# Patient Record
Sex: Female | Born: 1995 | Race: Black or African American | Hispanic: No | Marital: Single | Smoking: Never smoker
Health system: Southern US, Community
[De-identification: ages and names within clinical notes are randomized; demographics above are authoritative.]

## PROBLEM LIST (undated history)

## (undated) ENCOUNTER — Inpatient Hospital Stay (HOSPITAL_COMMUNITY): Payer: Self-pay

## (undated) DIAGNOSIS — Z789 Other specified health status: Secondary | ICD-10-CM

---
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
## 2019-01-22 ENCOUNTER — Encounter (HOSPITAL_COMMUNITY): Payer: Self-pay

## 2019-01-22 ENCOUNTER — Inpatient Hospital Stay (HOSPITAL_COMMUNITY)
Admission: AD | Admit: 2019-01-22 | Discharge: 2019-01-22 | Disposition: A | Discharge status: Home / Self Care | Payer: No Typology Code available for payment source | Source: Ambulatory Visit | Attending: Obstetrics & Gynecology | Admitting: Obstetrics & Gynecology

## 2019-01-22 ENCOUNTER — Inpatient Hospital Stay (HOSPITAL_COMMUNITY): Payer: No Typology Code available for payment source

## 2019-01-22 DIAGNOSIS — Z3A01 Less than 8 weeks gestation of pregnancy: Secondary | ICD-10-CM | POA: Diagnosis not present

## 2019-01-22 DIAGNOSIS — N760 Acute vaginitis: Secondary | ICD-10-CM | POA: Diagnosis not present

## 2019-01-22 DIAGNOSIS — O26891 Other specified pregnancy related conditions, first trimester: Secondary | ICD-10-CM

## 2019-01-22 DIAGNOSIS — R102 Pelvic and perineal pain: Secondary | ICD-10-CM

## 2019-01-22 DIAGNOSIS — B9689 Other specified bacterial agents as the cause of diseases classified elsewhere: Secondary | ICD-10-CM | POA: Diagnosis not present

## 2019-01-22 DIAGNOSIS — O26899 Other specified pregnancy related conditions, unspecified trimester: Secondary | ICD-10-CM

## 2019-01-22 DIAGNOSIS — O23591 Infection of other part of genital tract in pregnancy, first trimester: Secondary | ICD-10-CM | POA: Diagnosis not present

## 2019-01-22 DIAGNOSIS — R109 Unspecified abdominal pain: Secondary | ICD-10-CM | POA: Diagnosis present

## 2019-01-22 DIAGNOSIS — O3680X0 Pregnancy with inconclusive fetal viability, not applicable or unspecified: Secondary | ICD-10-CM

## 2019-01-22 HISTORY — DX: Other specified health status: Z78.9

## 2019-01-22 LAB — WET PREP, GENITAL
Sperm: NONE SEEN
Trich, Wet Prep: NONE SEEN
Yeast Wet Prep HPF POC: NONE SEEN

## 2019-01-22 LAB — CBC
HCT: 36.8 % (ref 36.0–46.0)
Hemoglobin: 12.7 g/dL (ref 12.0–15.0)
MCH: 30 pg (ref 26.0–34.0)
MCHC: 34.5 g/dL (ref 30.0–36.0)
MCV: 87 fL (ref 80.0–100.0)
Platelets: 305 10*3/uL (ref 150–400)
RBC: 4.23 MIL/uL (ref 3.87–5.11)
RDW: 12.6 % (ref 11.5–15.5)
WBC: 8.8 10*3/uL (ref 4.0–10.5)
nRBC: 0 % (ref 0.0–0.2)

## 2019-01-22 LAB — URINALYSIS, ROUTINE W REFLEX MICROSCOPIC
Bilirubin Urine: NEGATIVE
Glucose, UA: NEGATIVE mg/dL
Hgb urine dipstick: NEGATIVE
Ketones, ur: 15 mg/dL — AB
Leukocytes, UA: NEGATIVE
Nitrite: NEGATIVE
PH: 6.5 (ref 5.0–8.0)
Protein, ur: NEGATIVE mg/dL
Specific Gravity, Urine: 1.02 (ref 1.005–1.030)

## 2019-01-22 LAB — RAPID URINE DRUG SCREEN, HOSP PERFORMED
Amphetamines: NOT DETECTED
BENZODIAZEPINES: NOT DETECTED
Barbiturates: NOT DETECTED
Cocaine: NOT DETECTED
Opiates: NOT DETECTED
Tetrahydrocannabinol: NOT DETECTED

## 2019-01-22 LAB — HCG, QUANTITATIVE, PREGNANCY: hCG, Beta Chain, Quant, S: 2689 m[IU]/mL — ABNORMAL HIGH (ref <5)

## 2019-01-22 LAB — ABO/RH: ABO/RH(D): O POS

## 2019-01-22 LAB — POCT PREGNANCY, URINE: Preg Test, Ur: POSITIVE — AB

## 2019-01-22 LAB — HIV ANTIBODY (ROUTINE TESTING W REFLEX): HIV Screen 4th Generation wRfx: NONREACTIVE

## 2019-01-22 MED ORDER — HYOSCYAMINE SULFATE 0.125 MG SL SUBL: 0.1250 mg | SUBLINGUAL_TABLET | Freq: Once | SUBLINGUAL | Status: DC

## 2019-01-22 MED ORDER — TRAMADOL HCL 50 MG PO TABS
50.0000 mg | ORAL_TABLET | Freq: Once | ORAL | Status: AC
  Administered 2019-01-22: 50 mg via ORAL
  Filled 2019-01-22: qty 1

## 2019-01-22 MED ORDER — METRONIDAZOLE 500 MG PO TABS: 500.0000 mg | ORAL_TABLET | Freq: Two times a day (BID) | ORAL | 0 refills | Status: DC

## 2019-01-22 NOTE — Discharge Instructions (Signed)
First Trimester of Pregnancy  The first trimester of pregnancy is from week 1 until the end of week 13 (months 1 through 3). During this time, your baby will begin to develop inside you. At 6-8 weeks, the eyes and face are formed, and the heartbeat can be seen on ultrasound. At the end of 12 weeks, all the baby's organs are formed. Prenatal care is all the medical care you receive before the birth of your baby. Make sure you get good prenatal care and follow all of your doctor's instructions. Follow these instructions at home: Medicines  Take over-the-counter and prescription medicines only as told by your doctor. Some medicines are safe and some medicines are not safe during pregnancy.  Take a prenatal vitamin that contains at least 600 micrograms (mcg) of folic acid.  If you have trouble pooping (constipation), take medicine that will make your stool soft (stool softener) if your doctor approves. Eating and drinking   Eat regular, healthy meals.  Your doctor will tell you the amount of weight gain that is right for you.  Avoid raw meat and uncooked cheese.  If you feel sick to your stomach (nauseous) or throw up (vomit): ? Eat 4 or 5 small meals a day instead of 3 large meals. ? Try eating a few soda crackers. ? Drink liquids between meals instead of during meals.  To prevent constipation: ? Eat foods that are high in fiber, like fresh fruits and vegetables, whole grains, and beans. ? Drink enough fluids to keep your pee (urine) clear or pale yellow. Activity  Exercise only as told by your doctor. Stop exercising if you have cramps or pain in your lower belly (abdomen) or low back.  Do not exercise if it is too hot, too humid, or if you are in a place of great height (high altitude).  Try to avoid standing for long periods of time. Move your legs often if you must stand in one place for a long time.  Avoid heavy lifting.  Wear low-heeled shoes. Sit and stand up straight.   You can have sex unless your doctor tells you not to. Relieving pain and discomfort  Wear a good support bra if your breasts are sore.  Take warm water baths (sitz baths) to soothe pain or discomfort caused by hemorrhoids. Use hemorrhoid cream if your doctor says it is okay.  Rest with your legs raised if you have leg cramps or low back pain.  If you have puffy, bulging veins (varicose veins) in your legs: ? Wear support hose or compression stockings as told by your doctor. ? Raise (elevate) your feet for 15 minutes, 3-4 times a day. ? Limit salt in your food. Prenatal care  Schedule your prenatal visits by the twelfth week of pregnancy.  Write down your questions. Take them to your prenatal visits.  Keep all your prenatal visits as told by your doctor. This is important. Safety  Wear your seat belt at all times when driving.  Make a list of emergency phone numbers. The list should include numbers for family, friends, the hospital, and police and fire departments. General instructions  Ask your doctor for a referral to a local prenatal class. Begin classes no later than at the start of month 6 of your pregnancy.  Ask for help if you need counseling or if you need help with nutrition. Your doctor can give you advice or tell you where to go for help.  Do not use hot tubs, steam   rooms, or saunas.  Do not douche or use tampons or scented sanitary pads.  Do not cross your legs for long periods of time.  Avoid all herbs and alcohol. Avoid drugs that are not approved by your doctor.  Do not use any tobacco products, including cigarettes, chewing tobacco, and electronic cigarettes. If you need help quitting, ask your doctor. You may get counseling or other support to help you quit.  Avoid cat litter boxes and soil used by cats. These carry germs that can cause birth defects in the baby and can cause a loss of your baby (miscarriage) or stillbirth.  Visit your dentist. At home,  brush your teeth with a soft toothbrush. Be gentle when you floss. Contact a doctor if:  You are dizzy.  You have mild cramps or pressure in your lower belly.  You have a nagging pain in your belly area.  You continue to feel sick to your stomach, you throw up, or you have watery poop (diarrhea).  You have a bad smelling fluid coming from your vagina.  You have pain when you pee (urinate).  You have increased puffiness (swelling) in your face, hands, legs, or ankles. Get help right away if:  You have a fever.  You are leaking fluid from your vagina.  You have spotting or bleeding from your vagina.  You have very bad belly cramping or pain.  You gain or lose weight rapidly.  You throw up blood. It may look like coffee grounds.  You are around people who have German measles, fifth disease, or chickenpox.  You have a very bad headache.  You have shortness of breath.  You have any kind of trauma, such as from a fall or a car accident. Summary  The first trimester of pregnancy is from week 1 until the end of week 13 (months 1 through 3).  To take care of yourself and your unborn baby, you will need to eat healthy meals, take medicines only if your doctor tells you to do so, and do activities that are safe for you and your baby.  Keep all follow-up visits as told by your doctor. This is important as your doctor will have to ensure that your baby is healthy and growing well. This information is not intended to replace advice given to you by your health care provider. Make sure you discuss any questions you have with your health care provider. Document Released: 05/21/2008 Document Revised: 12/11/2016 Document Reviewed: 12/11/2016 Elsevier Interactive Patient Education  2019 Elsevier Inc[employer]  

## 2019-01-22 NOTE — MAU Note (Signed)
Had a positive HPT on Monday-confirmed at her Drs office on 2/2.  LMP 1/2.  Abdominal pain for the past week that is intermittent.  Also reports feeling hot/sweaty.  No VB.  Hasn't taken anything for the pain.

## 2019-01-22 NOTE — MAU Provider Note (Signed)
Chief Complaint: Abdominal Pain and Possible Pregnancy   First Provider Initiated Contact with Patient 01/22/19 0307        SUBJECTIVE HPI: Stephanie Ortiz is a 23 y.o. at [redacted]w[redacted]d by LMP who presents to maternity admissions reporting suprapubic cramping for several weeks, which got worse tonight.  No bleeding. Had a pregnancy test at Urgent Care but no other exams.  Was on sprintec pills but stopped them due to weight gain. . She denies vaginal bleeding, vaginal itching/burning, urinary symptoms, h/a, dizziness, n/v, or fever/chills.    Abdominal Pain  This is a new problem. The current episode started 1 to 4 weeks ago. The onset quality is gradual. The problem occurs intermittently. The problem has been gradually worsening. The pain is located in the suprapubic region. The quality of the pain is cramping and sharp. The abdominal pain does not radiate. Pertinent negatives include no constipation, diarrhea, dysuria, fever, frequency, headaches, myalgias, nausea or vomiting. Nothing aggravates the pain. The pain is relieved by nothing. She has tried nothing for the symptoms.  Possible Pregnancy  This is a new problem. The current episode started in the past 7 days. Associated symptoms include abdominal pain. Pertinent negatives include no chills, fever, headaches, myalgias, nausea or vomiting. Nothing aggravates the symptoms. She has tried nothing for the symptoms.   RN note: Had a positive HPT on Monday-confirmed at her Drs office on 2/2.  LMP 1/2.  Abdominal pain for the past week that is intermittent.  Also reports feeling hot/sweaty.  No VB.  Hasn't taken anything for the pain.   Social History   Socioeconomic History  . Marital status: Single    Spouse name: Not on file  . Number of children: Not on file  . Years of education: Not on file  . Highest education level: Not on file  Occupational History  . Not on file  Social Needs  . Financial resource strain: Not on file  . Food  insecurity:    Worry: Not on file    Inability: Not on file  . Transportation needs:    Medical: Not on file    Non-medical: Not on file  Tobacco Use  . Smoking status: Not on file  Substance and Sexual Activity  . Alcohol use: Not on file  . Drug use: Not on file  . Sexual activity: Not on file  Lifestyle  . Physical activity:    Days per week: Not on file    Minutes per session: Not on file  . Stress: Not on file  Relationships  . Social connections:    Talks on phone: Not on file    Gets together: Not on file    Attends religious service: Not on file    Active member of club or organization: Not on file    Attends meetings of clubs or organizations: Not on file    Relationship status: Not on file  . Intimate partner violence:    Fear of current or ex partner: Not on file    Emotionally abused: Not on file    Physically abused: Not on file    Forced sexual activity: Not on file  Other Topics Concern  . Not on file  Social History Narrative  . Not on file   No current facility-administered medications on file prior to encounter.    No current outpatient medications on file prior to encounter.   Allergies not on file  I have reviewed patient's Past Medical Hx, Surgical Hx,  Family Hx, Social Hx, medications and allergies.   ROS:  Review of Systems  Constitutional: Negative for chills and fever.  Gastrointestinal: Positive for abdominal pain. Negative for constipation, diarrhea, nausea and vomiting.  Genitourinary: Negative for dysuria and frequency.  Musculoskeletal: Negative for myalgias.  Neurological: Negative for headaches.   Review of Systems  Other systems negative   Physical Exam  Physical Exam Patient Vitals for the past 24 hrs:  BP Temp Pulse Resp SpO2 Height Weight  01/22/19 0259 128/81 99.2 F (37.3 C) 87 17 100 % 5\' 9"  (1.753 m) 77.6 kg   Constitutional: Well-developed, well-nourished female in no acute distress.  Cardiovascular: normal  rate Respiratory: normal effort GI: Abd soft, non-tender. Pos BS x 4 MS: Extremities nontender, no edema, normal ROM Neurologic: Alert and oriented x 4.  GU: Neg CVAT.  PELVIC EXAM: Cervix pink, visually closed, without lesion, scant white creamy discharge, vaginal walls and external genitalia normal Bimanual exam: Cervix 0/long/high, firm, anterior, neg CMT, uterus tender, nonenlarged, adnexa without tenderness, enlargement, or mass   LAB RESULTS Results for orders placed or performed during the hospital encounter of 01/22/19 (from the past 24 hour(s))  hCG, quantitative, pregnancy     Status: Abnormal   Collection Time: 01/22/19  3:11 AM  Result Value Ref Range   hCG, Beta Chain, Quant, S 2,689 (H) <5 mIU/mL  CBC     Status: None   Collection Time: 01/22/19  3:11 AM  Result Value Ref Range   WBC 8.8 4.0 - 10.5 K/uL   RBC 4.23 3.87 - 5.11 MIL/uL   Hemoglobin 12.7 12.0 - 15.0 g/dL   HCT 16.136.8 09.636.0 - 04.546.0 %   MCV 87.0 80.0 - 100.0 fL   MCH 30.0 26.0 - 34.0 pg   MCHC 34.5 30.0 - 36.0 g/dL   RDW 40.912.6 81.111.5 - 91.415.5 %   Platelets 305 150 - 400 K/uL   nRBC 0.0 0.0 - 0.2 %  ABO/Rh     Status: None   Collection Time: 01/22/19  3:11 AM  Result Value Ref Range   ABO/RH(D)      O POS Performed at [facility], [address] [place], [place] [id number]   Pregnancy, urine POC     Status: Abnormal   Collection Time: 01/22/19  3:16 AM  Result Value Ref Range   Preg Test, Ur POSITIVE (A) NEGATIVE  Urinalysis, Routine w reflex microscopic     Status: Abnormal   Collection Time: 01/22/19  3:23 AM  Result Value Ref Range   Color, Urine YELLOW YELLOW   APPearance CLEAR CLEAR   Specific Gravity, Urine 1.020 1.005 - 1.030   pH 6.5 5.0 - 8.0   Glucose, UA NEGATIVE NEGATIVE mg/dL   Hgb urine dipstick NEGATIVE NEGATIVE   Bilirubin Urine NEGATIVE NEGATIVE   Ketones, ur 15 (A) NEGATIVE mg/dL   Protein, ur NEGATIVE NEGATIVE mg/dL   Nitrite NEGATIVE NEGATIVE   Leukocytes, UA NEGATIVE  NEGATIVE  Wet prep, genital     Status: Abnormal   Collection Time: 01/22/19  3:42 AM  Result Value Ref Range   Yeast Wet Prep HPF POC NONE SEEN NONE SEEN   Trich, Wet Prep NONE SEEN NONE SEEN   Clue Cells Wet Prep HPF POC PRESENT (A) NONE SEEN   WBC, Wet Prep HPF POC FEW (A) NONE SEEN   Sperm NONE SEEN       IMAGING [place] Ob Comp Less 14 Wks  Result Date: 01/22/2019 CLINICAL DATA:  Pelvic  pain in first-trimester pregnancy EXAM: OBSTETRIC <14 WK US AND TRANSVAGINAL OB US TECHNIQUE: Both transabdominal and transvaginal ultrasound examinations were performed for complete evaluation of the gestation as well as the maternal uterus, adnexal regions, and pelvic cul-de-sac. Transvaginal technique was performed to assess early pregnancy. COMPARISON:  None. FINDINGS: Intrauterine gestational sac: Likely present Yolk sac:  Not seen Embryo:  Not Seen MSD: 3.2  mm   5 w   0  d Subchorionic hemorrhage:  None visualized. Maternal uterus/adnexae: Normal. IMPRESSION: Intrauterine sac (with mean sac diameter correlating with 5 weeks) likely reflecting early gestational but no embryo or yolk sac is visible yet. Consider follow-up quantitative B-HCG levels and follow-up US in 14 days to assess viability. No pathologic finding. Electronically Signed   By: Marnee SpringJonathon  Watts M.D.   On: 01/22/2019 04:32   [place] Ob Transvaginal  Result Date: 01/22/2019 CLINICAL DATA:  Pelvic pain in first-trimester pregnancy EXAM: OBSTETRIC <14 WK US AND TRANSVAGINAL OB US TECHNIQUE: Both transabdominal and transvaginal ultrasound examinations were performed for complete evaluation of the gestation as well as the maternal uterus, adnexal regions, and pelvic cul-de-sac. Transvaginal technique was performed to assess early pregnancy. COMPARISON:  None. FINDINGS: Intrauterine gestational sac: Likely present Yolk sac:  Not seen Embryo:  Not Seen MSD: 3.2  mm   5 w   0  d Subchorionic hemorrhage:  None visualized. Maternal uterus/adnexae: Normal.  IMPRESSION: Intrauterine sac (with mean sac diameter correlating with 5 weeks) likely reflecting early gestational but no embryo or yolk sac is visible yet. Consider follow-up quantitative B-HCG levels and follow-up US in 14 days to assess viability. No pathologic finding. Electronically Signed   By: Marnee SpringJonathon  Watts M.D.   On: 01/22/2019 04:32     MAU Management/MDM: Ordered usual first trimester r/o ectopic labs.   Pelvic exam and cultures done Will check baseline Ultrasound to rule out ectopic.  This bleeding/pain can represent a normal pregnancy with bleeding, spontaneous abortion or even an ectopic which can be life-threatening.  The process as listed above helps to determine which of these is present.  Reviewed findings.  Recommend repeating HCG on Saturday.  States is going out of town but I suggested she come before and get test done.   ASSESSMENT Pregnancy at 7160w0d by LMP Pelvic pain affecting pregnancy - Plan: US OB Comp Less 14 Wks, US OB Transvaginal, US OB Comp Less 14 Wks, US OB Transvaginal, Discharge patient  Pregnancy of unknown anatomic location - Plan: US OB Comp Less 14 Wks, US OB Transvaginal, US OB Comp Less 14 Wks, US OB Transvaginal, Discharge patient  Bacterial vaginosis - Plan: Discharge patient    PLAN Discharge home Plan to repeat HCG level in 48 hours Will repeat  Ultrasound in about 7-10 days if HCG levels double appropriately  Ectopic precautions Rx Flagyl for BV  Pt stable at time of discharge. Encouraged to return here or to other Urgent Care/ED if she develops worsening of symptoms, increase in pain, fever, or other concerning symptoms.    Wynelle BourgeoisMarie Capp CNM, MSN Certified Nurse-Midwife 01/22/2019  3:07 AM

## 2019-01-24 LAB — GC/CHLAMYDIA PROBE AMP (~~LOC~~) NOT AT ARMC
Chlamydia: NEGATIVE
Neisseria Gonorrhea: NEGATIVE

## 2020-11-11 ENCOUNTER — Encounter: Payer: Self-pay | Admitting: Emergency Medicine

## 2020-11-11 ENCOUNTER — Other Ambulatory Visit: Payer: Self-pay

## 2020-11-11 ENCOUNTER — Ambulatory Visit
Admission: EM | Admit: 2020-11-11 | Discharge: 2020-11-11 | Disposition: A | Discharge status: Home / Self Care | Payer: PRIVATE HEALTH INSURANCE | Attending: Emergency Medicine | Admitting: Emergency Medicine

## 2020-11-11 DIAGNOSIS — J069 Acute upper respiratory infection, unspecified: Secondary | ICD-10-CM | POA: Diagnosis not present

## 2020-11-11 DIAGNOSIS — Z20822 Contact with and (suspected) exposure to covid-19: Secondary | ICD-10-CM

## 2020-11-11 MED ORDER — FLUTICASONE PROPIONATE 50 MCG/ACT NA SUSP: 1.0000 | Freq: Every day | NASAL | 0 refills | Status: DC

## 2020-11-11 MED ORDER — CETIRIZINE HCL 10 MG PO TABS: 10.0000 mg | ORAL_TABLET | Freq: Every day | ORAL | 0 refills | Status: DC

## 2020-11-11 MED ORDER — BENZONATATE 100 MG PO CAPS: 100.0000 mg | ORAL_CAPSULE | Freq: Three times a day (TID) | ORAL | 0 refills | Status: DC

## 2020-11-11 NOTE — ED Provider Notes (Signed)
[name]    CSN: [id number] Arrival date & time: 11/11/20  1419      History   Chief Complaint Chief Complaint  Patient presents with  . Cough  . Fever    HPI Stephanie Ortiz is a 24 y.o. female  Presenting for 6-day course of fever, cough, congestion.  States cough is mildly productive: Clear/white.  No difficulty breathing, history of asthma.  Denies chest pain, vomiting.  States she feels a little bit better Wednesday and Thanksgiving, though felt worse after dinner.  No known sick contacts.  Past Medical History:  Diagnosis Date  . Medical history non-contributory     There are no problems to display for this patient.   History reviewed. No pertinent surgical history.  OB History    Gravida  1   Para      Term      Preterm      AB      Living        SAB      TAB      Ectopic      Multiple      Live Births               Home Medications    Prior to Admission medications   Medication Sig Start Date End Date Taking? Authorizing Provider  benzonatate (TESSALON) 100 MG capsule Take 1 capsule (100 mg total) by mouth every 8 (eight) hours. 11/11/20   Hall-Potvin, [place], PA-C  cetirizine (ZYRTEC ALLERGY) 10 MG tablet Take 1 tablet (10 mg total) by mouth daily. 11/11/20   Hall-Potvin, [place], PA-C  fluticasone (FLONASE) 50 MCG/ACT nasal spray Place 1 spray into both nostrils daily. 11/11/20   Hall-Potvin, [place], PA-C    Family History History reviewed. No pertinent family history.  Social History Social History   Tobacco Use  . Smoking status: Never Smoker  . Smokeless tobacco: Never Used  Substance Use Topics  . Alcohol use: Not Currently  . Drug use: Never     Allergies   Patient has no known allergies.   Review of Systems Review of Systems  Constitutional: Positive for fatigue and fever.  HENT: Positive for congestion. Negative for dental problem, ear pain, facial swelling, hearing loss, sinus pain,  sore throat, trouble swallowing and voice change.   Eyes: Negative for photophobia, pain and visual disturbance.  Respiratory: Positive for cough. Negative for shortness of breath and wheezing.   Cardiovascular: Negative for chest pain and palpitations.  Gastrointestinal: Negative for diarrhea and vomiting.  Musculoskeletal: Negative for arthralgias and myalgias.  Neurological: Negative for dizziness and headaches.     Physical Exam Triage Vital Signs ED Triage Vitals  Enc Vitals Group     BP 11/11/20 1505 129/89     Pulse Rate 11/11/20 1505 94     Resp 11/11/20 1505 18     Temp 11/11/20 1505 100 F (37.8 C)     Temp Source 11/11/20 1505 Oral     SpO2 11/11/20 1505 95 %     Weight --      Height --      Head Circumference --      Peak Flow --      Pain Score 11/11/20 1506 7     Pain Loc --      Pain Edu? --      Excl. in GC? --    No data found.  Updated Vital Signs BP 129/89 (BP Location: Left Arm)  Pulse 94   Temp 100 F (37.8 C) (Oral)   Resp 18   SpO2 95%   Visual Acuity Right Eye Distance:   Left Eye Distance:   Bilateral Distance:    Right Eye Near:   Left Eye Near:    Bilateral Near:     Physical Exam Constitutional:      General: She is not in acute distress.    Appearance: She is not ill-appearing or diaphoretic.  HENT:     Head: Normocephalic and atraumatic.     Right Ear: Tympanic membrane and ear canal normal.     Left Ear: Tympanic membrane and ear canal normal.     Mouth/Throat:     Mouth: Mucous membranes are moist.     Pharynx: Oropharynx is clear. No oropharyngeal exudate or posterior oropharyngeal erythema.  Eyes:     General: No scleral icterus.    Conjunctiva/sclera: Conjunctivae normal.     Pupils: Pupils are equal, round, and reactive to light.  Neck:     Comments: Trachea midline, negative JVD Cardiovascular:     Rate and Rhythm: Normal rate and regular rhythm.     Heart sounds: No murmur heard.  No gallop.   Pulmonary:      Effort: Pulmonary effort is normal. No respiratory distress.     Breath sounds: No wheezing, rhonchi or rales.  Musculoskeletal:     Cervical back: Neck supple. No tenderness.  Lymphadenopathy:     Cervical: No cervical adenopathy.  Skin:    Capillary Refill: Capillary refill takes less than 2 seconds.     Coloration: Skin is not jaundiced or pale.     Findings: No rash.  Neurological:     General: No focal deficit present.     Mental Status: She is alert and oriented to person, place, and time.      UC Treatments / Results  Labs (all labs ordered are listed, but only abnormal results are displayed) Labs Reviewed  NOVEL CORONAVIRUS, NAA    EKG   Radiology No results found.  Procedures Procedures (including critical care time)  Medications Ordered in UC Medications - No data to display  Initial Impression / Assessment and Plan / UC Course  I have reviewed the triage vital signs and the nursing notes.  Pertinent labs & imaging results that were available during my care of the patient were reviewed by me and considered in my medical decision making (see chart for details).     Patient afebrile, nontoxic, with SpO2 95%.  Covid PCR pending.  Patient to quarantine until results are back.  We will treat supportively as outlined below.  Return precautions discussed, patient verbalized understanding and is agreeable to plan. Final Clinical Impressions(s) / UC Diagnoses   Final diagnoses:  Encounter for screening laboratory testing for COVID-19 virus  Viral URI with cough     Discharge Instructions     Tessalon for cough. Start flonase, atrovent nasal spray for nasal congestion/drainage. You can use over the counter nasal saline rinse such as neti pot for nasal congestion. Keep hydrated, your urine should be clear to pale yellow in color. Tylenol/motrin for fever and pain. Monitor for any worsening of symptoms, chest pain, shortness of breath, wheezing, swelling of the  throat, go to the emergency department for further evaluation needed.     ED Prescriptions    Medication Sig Dispense Auth. Provider   cetirizine (ZYRTEC ALLERGY) 10 MG tablet Take 1 tablet (10 mg total) by mouth daily.  30 tablet Hall-Potvin, [place], PA-C   fluticasone (FLONASE) 50 MCG/ACT nasal spray Place 1 spray into both nostrils daily. 16 g Hall-Potvin, [place], PA-C   benzonatate (TESSALON) 100 MG capsule Take 1 capsule (100 mg total) by mouth every 8 (eight) hours. 21 capsule Hall-Potvin, [place], PA-C     PDMP not reviewed this encounter.   Hall-Potvin, [place], [place] 11/11/20 1615

## 2020-11-11 NOTE — Discharge Instructions (Signed)

## 2020-11-11 NOTE — ED Triage Notes (Signed)
Pt here for fever, cough and congestion with HA x 6 days

## 2020-11-12 LAB — NOVEL CORONAVIRUS, NAA: SARS-CoV-2, NAA: NOT DETECTED

## 2020-11-12 LAB — SARS-COV-2, NAA 2 DAY TAT

## 2021-09-05 ENCOUNTER — Ambulatory Visit
Admission: EM | Admit: 2021-09-05 | Discharge: 2021-09-05 | Disposition: A | Discharge status: Home / Self Care | Payer: No Typology Code available for payment source | Attending: Physician Assistant | Admitting: Physician Assistant

## 2021-09-05 ENCOUNTER — Other Ambulatory Visit: Payer: Self-pay

## 2021-09-05 DIAGNOSIS — L309 Dermatitis, unspecified: Secondary | ICD-10-CM | POA: Diagnosis not present

## 2021-09-05 MED ORDER — PREDNISONE 10 MG PO TABS: 20.0000 mg | ORAL_TABLET | Freq: Every day | ORAL | 0 refills | Status: AC

## 2021-09-05 NOTE — ED Provider Notes (Signed)
[name]    CSN: [id number] Arrival date & time: 09/05/21  1917      History   Chief Complaint Chief Complaint  Patient presents with   Rash    HPI Stephanie Ortiz is a 25 y.o. female.   Patient here today for evaluation of itchy rash that she first noticed yesterday.  She states that rash has been present on most of her body, but has resolved in many areas.  She still has some itching however, and Benadryl has not been helpful to take care of this.  She denies any new exposures but did just return from [place].  She has not had any fever or chills.  She denies any trouble swallowing or difficulty breathing.   Rash Associated symptoms: no fever, no shortness of breath and not wheezing    Past Medical History:  Diagnosis Date   Medical history non-contributory     There are no problems to display for this patient.   History reviewed. No pertinent surgical history.  OB History     Gravida  1   Para      Term      Preterm      AB      Living         SAB      IAB      Ectopic      Multiple      Live Births               Home Medications    Prior to Admission medications   Medication Sig Start Date End Date Taking? Authorizing Provider  predniSONE (DELTASONE) 10 MG tablet Take 2 tablets (20 mg total) by mouth daily for 5 days. 09/05/21 09/10/21 Yes Tomi Bamberger, PA-C  benzonatate (TESSALON) 100 MG capsule Take 1 capsule (100 mg total) by mouth every 8 (eight) hours. 11/11/20   Hall-Potvin, [place], PA-C  cetirizine (ZYRTEC ALLERGY) 10 MG tablet Take 1 tablet (10 mg total) by mouth daily. 11/11/20   Hall-Potvin, [place], PA-C  fluticasone (FLONASE) 50 MCG/ACT nasal spray Place 1 spray into both nostrils daily. 11/11/20   Hall-Potvin, [place], PA-C    Family History History reviewed. No pertinent family history.  Social History Social History   Tobacco Use   Smoking status: Never   Smokeless tobacco: Never   Substance Use Topics   Alcohol use: Not Currently   Drug use: Never     Allergies   Patient has no known allergies.   Review of Systems Review of Systems  Constitutional:  Negative for chills and fever.  HENT:  Negative for facial swelling and trouble swallowing.   Eyes:  Negative for discharge and redness.  Respiratory:  Negative for shortness of breath and wheezing.   Skin:  Positive for rash.    Physical Exam Triage Vital Signs ED Triage Vitals  Enc Vitals Group     BP 09/05/21 1927 108/76     Pulse Rate 09/05/21 1927 76     Resp 09/05/21 1927 18     Temp 09/05/21 1927 98.1 F (36.7 C)     Temp Source 09/05/21 1927 Oral     SpO2 09/05/21 1927 95 %     Weight --      Height --      Head Circumference --      Peak Flow --      Pain Score 09/05/21 1929 0     Pain Loc --  Pain Edu? --      Excl. in [place]? --    No data found.  Updated Vital Signs BP 108/76 (BP Location: Left Arm)   Pulse 76   Temp 98.1 F (36.7 C) (Oral)   Resp 18   LMP 08/27/2021 (Exact Date)   SpO2 95%   Breastfeeding No      Physical Exam Vitals and nursing note reviewed.  Constitutional:      General: She is not in acute distress.    Appearance: Normal appearance. She is not ill-appearing.  HENT:     Head: Normocephalic and atraumatic.     Nose: Nose normal.  Eyes:     Conjunctiva/sclera: Conjunctivae normal.  Cardiovascular:     Rate and Rhythm: Normal rate and regular rhythm.     Heart sounds: Normal heart sounds. No murmur heard. Pulmonary:     Effort: Pulmonary effort is normal. No respiratory distress.     Breath sounds: Normal breath sounds. No wheezing, rhonchi or rales.  Skin:    General: Skin is warm and dry.     Findings: Rash (urticarial rash noted on cell phone pictures) present.  Neurological:     Mental Status: She is alert.  Psychiatric:        Mood and Affect: Mood normal.        Behavior: Behavior normal.        Thought Content: Thought content normal.      UC Treatments / Results  Labs (all labs ordered are listed, but only abnormal results are displayed) Labs Reviewed - No data to display  EKG   Radiology No results found.  Procedures Procedures (including critical care time)  Medications Ordered in UC Medications - No data to display  Initial Impression / Assessment and Plan / UC Course  I have reviewed the triage vital signs and the nursing notes.  Pertinent labs & imaging results that were available during my care of the patient were reviewed by me and considered in my medical decision making (see chart for details).  Short course of steroids prescribed for suspected dermatitis.  Unknown etiology.  Recommended she continue Benadryl as needed for itching.  Encouraged follow-up if symptoms fail to improve or worsen in any way.  Final Clinical Impressions(s) / UC Diagnoses   Final diagnoses:  Dermatitis     Discharge Instructions      Take prednisone as prescribed. Ok to use Benadryl if needed for itching. Follow up if symptoms fail to improve or worsen in any way.      ED Prescriptions     Medication Sig Dispense Auth. Provider   predniSONE (DELTASONE) 10 MG tablet Take 2 tablets (20 mg total) by mouth daily for 5 days. 10 tablet Tomi Bamberger, PA-C      PDMP not reviewed this encounter.   Tomi Bamberger, PA-C 09/05/21 1947

## 2021-09-05 NOTE — ED Triage Notes (Signed)
Onset yesterday of pruritic rash. Pt notes rash started on her anterior neck and spread to her BUE, back, buttocks and BLE. Has been taking benadryl without relief. Also used an anti itch cream with temporary relief. No new lotions or soaps used. Notes she returned from [place] a few days ago.

## 2021-09-05 NOTE — Discharge Instructions (Addendum)
Take prednisone as prescribed. Ok to use Benadryl if needed for itching. Follow up if symptoms fail to improve or worsen in any way.

## 2021-10-04 ENCOUNTER — Other Ambulatory Visit: Payer: Self-pay

## 2021-10-04 ENCOUNTER — Encounter: Payer: Self-pay | Admitting: Emergency Medicine

## 2021-10-04 ENCOUNTER — Ambulatory Visit
Admission: EM | Admit: 2021-10-04 | Discharge: 2021-10-04 | Disposition: A | Discharge status: Home / Self Care | Payer: No Typology Code available for payment source | Attending: Physician Assistant | Admitting: Physician Assistant

## 2021-10-04 DIAGNOSIS — J069 Acute upper respiratory infection, unspecified: Secondary | ICD-10-CM | POA: Insufficient documentation

## 2021-10-04 DIAGNOSIS — R509 Fever, unspecified: Secondary | ICD-10-CM | POA: Insufficient documentation

## 2021-10-04 LAB — POCT RAPID STREP A (OFFICE): Rapid Strep A Screen: NEGATIVE

## 2021-10-04 MED ORDER — IBUPROFEN 800 MG PO TABS
800.0000 mg | ORAL_TABLET | Freq: Once | ORAL | Status: AC
  Administered 2021-10-04: 800 mg via ORAL

## 2021-10-04 NOTE — ED Triage Notes (Signed)
Patient c/o sore throat, headache and fever since Monday.  Patient has taken Thera-flu.  Patient is vaccinated for COVID.

## 2021-10-05 NOTE — ED Provider Notes (Signed)
[name]    CSN: [id number] Arrival date & time: 10/04/21  1901      History   Chief Complaint Chief Complaint  Patient presents with   Sore Throat    HPI Stephanie Ortiz is a 25 y.o. female.   Patient here today for evaluation of sore throat, fever, and headache that she has had for the last 3 days.  She has not had significant cough or congestion.  She has tried over-the-counter treatment without significant relief.  She denies any abdominal pain, nausea vomiting or diarrhea.  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  The history is provided by the patient.  Sore Throat Associated symptoms include headaches. Pertinent negatives include no abdominal pain and no shortness of breath.   Past Medical History:  Diagnosis Date   Medical history non-contributory     There are no problems to display for this patient.   History reviewed. No pertinent surgical history.  OB History     Gravida  1   Para      Term      Preterm      AB      Living         SAB      IAB      Ectopic      Multiple      Live Births               Home Medications    Prior to Admission medications   Medication Sig Start Date End Date Taking? Authorizing Provider  benzonatate (TESSALON) 100 MG capsule Take 1 capsule (100 mg total) by mouth every 8 (eight) hours. 11/11/20   Hall-Potvin, [place], PA-C  cetirizine (ZYRTEC ALLERGY) 10 MG tablet Take 1 tablet (10 mg total) by mouth daily. 11/11/20   Hall-Potvin, [place], PA-C  fluticasone (FLONASE) 50 MCG/ACT nasal spray Place 1 spray into both nostrils daily. 11/11/20   Hall-Potvin, [place], PA-C    Family History History reviewed. No pertinent family history.  Social History Social History   Tobacco Use   Smoking status: Never   Smokeless tobacco: Never  Substance Use Topics   Alcohol use: Not Currently   Drug use: Never     Allergies   Patient has no known  allergies.   Review of Systems Review of Systems  Constitutional:  Positive for fever.  HENT:  Positive for sore throat. Negative for congestion, ear pain and sinus pressure.   Eyes:  Negative for discharge and redness.  Respiratory:  Negative for cough, shortness of breath and wheezing.   Gastrointestinal:  Negative for abdominal pain, diarrhea, nausea and vomiting.  Neurological:  Positive for headaches.    Physical Exam Triage Vital Signs ED Triage Vitals [10/04/21 1940]  Enc Vitals Group     BP 120/80     Pulse Rate (!) 104     Resp      Temp (!) 102.8 F (39.3 C)     Temp Source Oral     SpO2 99 %     Weight 185 lb (83.9 kg)     Height 5\' 9"  (1.753 m)     Head Circumference      Peak Flow      Pain Score 10     Pain Loc      Pain Edu?      Excl. in GC?    No data found.  Updated Vital Signs BP 120/80 (BP  Location: Right Arm)   Pulse (!) 104   Temp (!) 102.8 F (39.3 C) (Oral)   Ht 5\' 9"  (1.753 m)   Wt 185 lb (83.9 kg)   LMP 09/24/2021   SpO2 99%   BMI 27.32 kg/m   Physical Exam Vitals and nursing note reviewed.  Constitutional:      General: She is not in acute distress.    Appearance: Normal appearance. She is not ill-appearing.  HENT:     Head: Normocephalic and atraumatic.     Nose: No congestion.     Mouth/Throat:     Mouth: Mucous membranes are moist.     Pharynx: Posterior oropharyngeal erythema present. No oropharyngeal exudate.  Eyes:     Conjunctiva/sclera: Conjunctivae normal.  Cardiovascular:     Rate and Rhythm: Normal rate and regular rhythm.     Heart sounds: Normal heart sounds. No murmur heard. Pulmonary:     Effort: Pulmonary effort is normal. No respiratory distress.     Breath sounds: Normal breath sounds. No wheezing, rhonchi or rales.  Skin:    General: Skin is warm and dry.  Neurological:     Mental Status: She is alert.  Psychiatric:        Mood and Affect: Mood normal.        Thought Content: Thought content normal.      UC Treatments / Results  Labs (all labs ordered are listed, but only abnormal results are displayed) Labs Reviewed  COVID-19, FLU A+B NAA  CULTURE, GROUP A STREP [facility])  POCT RAPID STREP A (OFFICE)    EKG   Radiology No results found.  Procedures Procedures (including critical care time)  Medications Ordered in UC Medications  ibuprofen (ADVIL) tablet 800 mg (800 mg Oral Given 10/04/21 1948)    Initial Impression / Assessment and Plan / UC Course  I have reviewed the triage vital signs and the nursing notes.  Pertinent labs & imaging results that were available during my care of the patient were reviewed by me and considered in my medical decision making (see chart for details).  Rapid strep negative in office.  Will order throat culture as well as COVID, flu screening.  Will await results for further recommendation.  Encouraged symptomatic treatment in the meantime.  Follow-up with any further concerns  Final Clinical Impressions(s) / UC Diagnoses   Final diagnoses:  Fever, unspecified  Acute upper respiratory infection   Discharge Instructions   None    ED Prescriptions   None    PDMP not reviewed this encounter.   10/06/21, PA-C 10/05/21 [phone]

## 2021-10-06 LAB — COVID-19, FLU A+B NAA
Influenza A, NAA: NOT DETECTED
Influenza B, NAA: NOT DETECTED
SARS-CoV-2, NAA: NOT DETECTED

## 2021-10-08 LAB — CULTURE, GROUP A STREP (THRC)

## 2022-01-25 ENCOUNTER — Encounter (HOSPITAL_BASED_OUTPATIENT_CLINIC_OR_DEPARTMENT_OTHER): Payer: Self-pay

## 2022-01-25 ENCOUNTER — Emergency Department (HOSPITAL_BASED_OUTPATIENT_CLINIC_OR_DEPARTMENT_OTHER): Payer: PRIVATE HEALTH INSURANCE

## 2022-01-25 ENCOUNTER — Other Ambulatory Visit: Payer: Self-pay

## 2022-01-25 ENCOUNTER — Emergency Department (HOSPITAL_BASED_OUTPATIENT_CLINIC_OR_DEPARTMENT_OTHER)
Admission: EM | Admit: 2022-01-25 | Discharge: 2022-01-25 | Disposition: A | Discharge status: Home / Self Care | Payer: PRIVATE HEALTH INSURANCE | Attending: Emergency Medicine | Admitting: Emergency Medicine

## 2022-01-25 DIAGNOSIS — S0990XA Unspecified injury of head, initial encounter: Secondary | ICD-10-CM | POA: Insufficient documentation

## 2022-01-25 DIAGNOSIS — R519 Headache, unspecified: Secondary | ICD-10-CM | POA: Diagnosis present

## 2022-01-25 DIAGNOSIS — S7002XA Contusion of left hip, initial encounter: Secondary | ICD-10-CM | POA: Insufficient documentation

## 2022-01-25 DIAGNOSIS — Y92410 Unspecified street and highway as the place of occurrence of the external cause: Secondary | ICD-10-CM | POA: Insufficient documentation

## 2022-01-25 MED ORDER — METHOCARBAMOL 500 MG PO TABS: 500.0000 mg | ORAL_TABLET | Freq: Three times a day (TID) | ORAL | 0 refills | Status: DC | PRN
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
## 2022-01-25 NOTE — ED Notes (Signed)
Dc instructions reviewed with patient. Patient voiced understanding. Dc with belongings.  °
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
## 2022-03-13 ENCOUNTER — Other Ambulatory Visit: Payer: Self-pay

## 2022-03-13 ENCOUNTER — Encounter: Payer: Self-pay | Admitting: Emergency Medicine

## 2022-03-13 ENCOUNTER — Ambulatory Visit
Admission: EM | Admit: 2022-03-13 | Discharge: 2022-03-13 | Disposition: A | Discharge status: Home / Self Care | Payer: No Typology Code available for payment source | Attending: Physician Assistant | Admitting: Physician Assistant

## 2022-03-13 DIAGNOSIS — J029 Acute pharyngitis, unspecified: Secondary | ICD-10-CM | POA: Diagnosis not present

## 2022-03-13 LAB — POCT RAPID STREP A (OFFICE): Rapid Strep A Screen: NEGATIVE

## 2022-03-13 MED ORDER — ACETAMINOPHEN 325 MG PO TABS
650.0000 mg | ORAL_TABLET | Freq: Once | ORAL | Status: AC
  Administered 2022-03-13: 650 mg via ORAL

## 2022-03-13 MED ORDER — AMOXICILLIN 500 MG PO CAPS: 500.0000 mg | ORAL_CAPSULE | Freq: Three times a day (TID) | ORAL | 0 refills | Status: DC

## 2022-03-13 NOTE — ED Triage Notes (Signed)
Pt here for fever and sore throat x 3 days  ?

## 2022-03-13 NOTE — ED Provider Notes (Signed)
?[place] ? ? ? ?CSN: [id number] ?Arrival date & time: 03/13/22  1649 ? ? ?  ? ?History   ?Chief Complaint ?Chief Complaint  ?Patient presents with  ? Fever  ? ? ?HPI ?Stephanie Ortiz is a 26 y.o. female.  ? ?Patient here today for evaluation of fever and sore throat she has had for 3 days.  She reports some congestion but no cough.  She has been taking over-the-counter medication without significant relief.  She is a Radio producer. ? ?The history is provided by the patient.  ?Fever ?Associated symptoms: congestion and sore throat   ?Associated symptoms: no cough, no diarrhea, no ear pain, no nausea and no vomiting   ? ?Past Medical History:  ?Diagnosis Date  ? Medical history non-contributory   ? ? ?There are no problems to display for this patient. ? ? ?History reviewed. No pertinent surgical history. ? ?OB History   ? ? Gravida  ?1  ? Para  ?   ? Term  ?   ? Preterm  ?   ? AB  ?   ? Living  ?   ?  ? ? SAB  ?   ? IAB  ?   ? Ectopic  ?   ? Multiple  ?   ? Live Births  ?   ?   ?  ?  ? ? ? ?Home Medications   ? ?Prior to Admission medications   ?Medication Sig Start Date End Date Taking? Authorizing Provider  ?amoxicillin (AMOXIL) 500 MG capsule Take 1 capsule (500 mg total) by mouth 3 (three) times daily. 03/13/22  Yes Francene Finders, PA-C  ?benzonatate (TESSALON) 100 MG capsule Take 1 capsule (100 mg total) by mouth every 8 (eight) hours. ?Patient not taking: Reported on 01/25/2022 11/11/20   Hall-Potvin, [place], PA-C  ?cetirizine (ZYRTEC ALLERGY) 10 MG tablet Take 1 tablet (10 mg total) by mouth daily. ?Patient not taking: Reported on 01/25/2022 11/11/20   Hall-Potvin, [place], PA-C  ?fluticasone (FLONASE) 50 MCG/ACT nasal spray Place 1 spray into both nostrils daily. ?Patient not taking: Reported on 01/25/2022 11/11/20   Hall-Potvin, [place], PA-C  ?methocarbamol (ROBAXIN) 500 MG tablet Take 1 tablet (500 mg total) by mouth every 8 (eight) hours as needed for muscle spasms. 01/25/22   Davonna Belling, MD  ? ? ?Family History ?History reviewed. No pertinent family history. ? ?Social History ?Social History  ? ?Tobacco Use  ? Smoking status: Never  ? Smokeless tobacco: Never  ?Substance Use Topics  ? Alcohol use: Not Currently  ? Drug use: Never  ? ? ? ?Allergies   ?Patient has no known allergies. ? ? ?Review of Systems ?Review of Systems  ?Constitutional:  Positive for fever.  ?HENT:  Positive for congestion and sore throat. Negative for ear pain.   ?Eyes:  Negative for discharge and redness.  ?Respiratory:  Negative for cough, shortness of breath and wheezing.   ?Gastrointestinal:  Negative for abdominal pain, diarrhea, nausea and vomiting.  ? ? ?Physical Exam ?Triage Vital Signs ?ED Triage Vitals  ?Enc Vitals Group  ?   BP 03/13/22 1906 128/83  ?   Pulse Rate 03/13/22 1906 (!) 107  ?   Resp 03/13/22 1906 18  ?   Temp 03/13/22 1906 (!) 101.8 ?F (38.8 ?C)  ?   Temp Source 03/13/22 1906 Oral  ?   SpO2 03/13/22 1906 97 %  ?   Weight --   ?   Height --   ?  Head Circumference --   ?   Peak Flow --   ?   Pain Score 03/13/22 1907 8  ?   Pain Loc --   ?   Pain Edu? --   ?   Excl. in [place]? --   ? ?No data found. ? ?Updated Vital Signs ?BP 128/83 (BP Location: Left Arm)   Pulse (!) 107   Temp (!) 101.8 ?F (38.8 ?C) (Oral)   Resp 18   SpO2 97%  ?   ? ?Physical Exam ?Vitals and nursing note reviewed.  ?Constitutional:   ?   General: She is not in acute distress. ?   Appearance: Normal appearance. She is not ill-appearing.  ?HENT:  ?   Head: Normocephalic and atraumatic.  ?   Nose: Congestion present.  ?   Mouth/Throat:  ?   Mouth: Mucous membranes are moist.  ?   Pharynx: Posterior oropharyngeal erythema present. No oropharyngeal exudate.  ?Eyes:  ?   Conjunctiva/sclera: Conjunctivae normal.  ?Cardiovascular:  ?   Rate and Rhythm: Normal rate.  ?Pulmonary:  ?   Effort: Pulmonary effort is normal. No respiratory distress.  ?Skin: ?   General: Skin is warm and dry.  ?Neurological:  ?   Mental Status: She is alert.   ?Psychiatric:     ?   Mood and Affect: Mood normal.     ?   Thought Content: Thought content normal.  ? ? ? ?UC Treatments / Results  ?Labs ?(all labs ordered are listed, but only abnormal results are displayed) ?Labs Reviewed  ?COVID-19, FLU A+B NAA  ?POCT RAPID STREP A (OFFICE)  ? ? ?EKG ? ? ?Radiology ?No results found. ? ?Procedures ?Procedures (including critical care time) ? ?Medications Ordered in UC ?Medications  ?acetaminophen (TYLENOL) tablet 650 mg (650 mg Oral Given 03/13/22 1915)  ? ? ?Initial Impression / Assessment and Plan / UC Course  ?I have reviewed the triage vital signs and the nursing notes. ? ?Pertinent labs & imaging results that were available during my care of the patient were reviewed by me and considered in my medical decision making (see chart for details). ? ?  ?Strep test negative in office.  Will treat to cover strep however given fever and symptoms. Covid and flu screening also ordered. Encouraged follow up with any further concerns.  ? ?Final Clinical Impressions(s) / UC Diagnoses  ? ?Final diagnoses:  ?Acute pharyngitis, unspecified etiology  ? ?Discharge Instructions   ?None ?  ? ?ED Prescriptions   ? ? Medication Sig Dispense Auth. Provider  ? amoxicillin (AMOXIL) 500 MG capsule Take 1 capsule (500 mg total) by mouth 3 (three) times daily. 21 capsule Francene Finders, PA-C  ? ?  ? ?PDMP not reviewed this encounter. ?  ?Francene Finders, PA-C ?03/13/22 1959 ? ?

## 2022-03-14 LAB — COVID-19, FLU A+B NAA
Influenza A, NAA: NOT DETECTED
Influenza B, NAA: NOT DETECTED
SARS-CoV-2, NAA: NOT DETECTED
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
## 2023-07-25 ENCOUNTER — Ambulatory Visit
Admission: EM | Admit: 2023-07-25 | Discharge: 2023-07-25 | Disposition: A | Discharge status: Home / Self Care | Payer: 59 | Attending: Physician Assistant | Admitting: Physician Assistant

## 2023-07-25 DIAGNOSIS — J329 Chronic sinusitis, unspecified: Secondary | ICD-10-CM

## 2023-07-25 LAB — POCT RAPID STREP A (OFFICE): Rapid Strep A Screen: NEGATIVE

## 2023-07-25 MED ORDER — AMOXICILLIN-POT CLAVULANATE 875-125 MG PO TABS: 1.0000 | ORAL_TABLET | Freq: Two times a day (BID) | ORAL | 0 refills | Status: DC

## 2023-07-25 NOTE — ED Provider Notes (Signed)
[name]    CSN: [id number] Arrival date & time: 07/25/23  1007      History   Chief Complaint Chief Complaint  Patient presents with   Sore Throat    HPI Stephanie Ortiz is a 27 y.o. female.   Patient here today for evaluation of nasal congestion and drainage, sore throat that started about 6 days ago.  She reports that her sore throat has lingered and she started to have some nosebleeds.  She has not had any fever.  She has taken over-the-counter medication without resolution.  The history is provided by the patient.  Sore Throat Pertinent negatives include no abdominal pain and no shortness of breath.    Past Medical History:  Diagnosis Date   Medical history non-contributory     There are no problems to display for this patient.   History reviewed. No pertinent surgical history.  OB History     Gravida  1   Para      Term      Preterm      AB      Living         SAB      IAB      Ectopic      Multiple      Live Births               Home Medications    Prior to Admission medications   Medication Sig Start Date End Date Taking? Authorizing Provider  amoxicillin-clavulanate (AUGMENTIN) 875-125 MG tablet Take 1 tablet by mouth every 12 (twelve) hours. 07/25/23  Yes Tomi Bamberger, PA-C  benzonatate (TESSALON) 100 MG capsule Take 1 capsule (100 mg total) by mouth every 8 (eight) hours. Patient not taking: Reported on 01/25/2022 11/11/20   Hall-Potvin, [place], PA-C  cetirizine (ZYRTEC ALLERGY) 10 MG tablet Take 1 tablet (10 mg total) by mouth daily. Patient not taking: Reported on 01/25/2022 11/11/20   Hall-Potvin, [place], PA-C  fluticasone (FLONASE) 50 MCG/ACT nasal spray Place 1 spray into both nostrils daily. Patient not taking: Reported on 01/25/2022 11/11/20   Hall-Potvin, [place], PA-C  methocarbamol (ROBAXIN) 500 MG tablet Take 1 tablet (500 mg total) by mouth every 8 (eight) hours as needed for muscle spasms. 01/25/22    Benjiman Core, MD    Family History History reviewed. No pertinent family history.  Social History Social History   Tobacco Use   Smoking status: Never   Smokeless tobacco: Never  Vaping Use   Vaping status: Never Used  Substance Use Topics   Alcohol use: Not Currently   Drug use: Never     Allergies   Patient has no known allergies.   Review of Systems Review of Systems  Constitutional:  Negative for chills and fever.  HENT:  Positive for congestion, nosebleeds, sinus pressure and sore throat. Negative for ear pain.   Eyes:  Negative for discharge and redness.  Respiratory:  Negative for cough, shortness of breath and wheezing.   Gastrointestinal:  Negative for abdominal pain, diarrhea, nausea and vomiting.     Physical Exam Triage Vital Signs ED Triage Vitals  Encounter Vitals Group     BP 07/25/23 1017 114/78     Systolic BP Percentile --      Diastolic BP Percentile --      Pulse Rate 07/25/23 1017 80     Resp 07/25/23 1017 16     Temp 07/25/23 1017 98.1 F (36.7 C)  Temp Source 07/25/23 1017 Oral     SpO2 07/25/23 1017 97 %     Weight 07/25/23 1015 189 lb (85.7 kg)     Height 07/25/23 1015 5\' 8"  (1.727 m)     Head Circumference --      Peak Flow --      Pain Score 07/25/23 1015 9     Pain Loc --      Pain Education --      Exclude from Growth Chart --    No data found.  Updated Vital Signs BP 114/78 (BP Location: Left Arm)   Pulse 80   Temp 98.1 F (36.7 C) (Oral)   Resp 16   Ht 5\' 8"  (1.727 m)   Wt 189 lb (85.7 kg)   LMP 06/28/2023 (Exact Date)   SpO2 97%   BMI 28.74 kg/m      Physical Exam Vitals and nursing note reviewed.  Constitutional:      General: She is not in acute distress.    Appearance: Normal appearance. She is not ill-appearing.  HENT:     Head: Normocephalic and atraumatic.     Right Ear: Tympanic membrane normal.     Left Ear: Tympanic membrane normal.     Nose: Congestion present.     Mouth/Throat:      Mouth: Mucous membranes are moist.     Pharynx: Posterior oropharyngeal erythema present. No oropharyngeal exudate.  Eyes:     Conjunctiva/sclera: Conjunctivae normal.  Cardiovascular:     Rate and Rhythm: Normal rate and regular rhythm.     Heart sounds: Normal heart sounds. No murmur heard. Pulmonary:     Effort: Pulmonary effort is normal. No respiratory distress.     Breath sounds: Normal breath sounds. No wheezing, rhonchi or rales.  Skin:    General: Skin is warm and dry.  Neurological:     Mental Status: She is alert.  Psychiatric:        Mood and Affect: Mood normal.        Thought Content: Thought content normal.      UC Treatments / Results  Labs (all labs ordered are listed, but only abnormal results are displayed) Labs Reviewed  POCT RAPID STREP A (OFFICE)    EKG   Radiology No results found.  Procedures Procedures (including critical care time)  Medications Ordered in UC Medications - No data to display  Initial Impression / Assessment and Plan / UC Course  I have reviewed the triage vital signs and the nursing notes.  Pertinent labs & imaging results that were available during my care of the patient were reviewed by me and considered in my medical decision making (see chart for details).    Will treat with antibiotic to cover possible sinusitis. Encouraged follow up if no gradual improvement or with any further concerns.   Final Clinical Impressions(s) / UC Diagnoses   Final diagnoses:  Sinusitis, unspecified chronicity, unspecified location   Discharge Instructions   None    ED Prescriptions     Medication Sig Dispense Auth. Provider   amoxicillin-clavulanate (AUGMENTIN) 875-125 MG tablet Take 1 tablet by mouth every 12 (twelve) hours. 14 tablet Tomi Bamberger, PA-C      PDMP not reviewed this encounter.   Tomi Bamberger, PA-C 07/25/23 1350

## 2023-07-25 NOTE — ED Triage Notes (Signed)
"  I feel like I have a summer cold but my sore throat isn't getting better". My nose is also bleeding often "last episode on the way to UC for about 2 mins". No fever.

## 2023-12-15 ENCOUNTER — Ambulatory Visit (INDEPENDENT_AMBULATORY_CARE_PROVIDER_SITE_OTHER): Payer: BC Managed Care – PPO

## 2023-12-15 ENCOUNTER — Ambulatory Visit
Admission: EM | Admit: 2023-12-15 | Discharge: 2023-12-15 | Disposition: A | Discharge status: Home / Self Care | Payer: BC Managed Care – PPO | Attending: Physician Assistant | Admitting: Physician Assistant

## 2023-12-15 DIAGNOSIS — M25521 Pain in right elbow: Secondary | ICD-10-CM

## 2023-12-15 DIAGNOSIS — W19XXXA Unspecified fall, initial encounter: Secondary | ICD-10-CM | POA: Diagnosis not present

## 2023-12-15 DIAGNOSIS — S5001XA Contusion of right elbow, initial encounter: Secondary | ICD-10-CM | POA: Diagnosis not present

## 2023-12-15 MED ORDER — NAPROXEN 375 MG PO TABS: 375.0000 mg | ORAL_TABLET | Freq: Two times a day (BID) | ORAL | 0 refills | Status: AC

## 2023-12-15 NOTE — ED Provider Notes (Signed)
[name]    CSN: [id number] Arrival date & time: 12/15/23  1040      History   Chief Complaint No chief complaint on file.   HPI Stephanie Ortiz is a 27 y.o. female.   Patient presents today for evaluation after a fall that occurred yesterday.  Reports that she slipped on a wet floor that was not labeled while at work and fell backwards with the majority of her weight on her right elbow.  She did not hit her head and denies any loss of consciousness, headache, dizziness, nausea, vomiting.  She does not take any blood thinning medication.  Her primary concern today is right elbow pain.  She reports that pain is rated 8 on a 0-10 pain scale, described as pressure/aching with periodic sharp pains, worse with certain movement such as flexion, no alleviating factors identified.  She denies any numbness or paresthesias in the hand.  Denies previous injury or surgery involving her hand/elbow.  She has tried Tylenol without improvement.  She is right-handed.    Past Medical History:  Diagnosis Date   Medical history non-contributory     There are no active problems to display for this patient.   History reviewed. No pertinent surgical history.  OB History     Gravida  1   Para      Term      Preterm      AB      Living         SAB      IAB      Ectopic      Multiple      Live Births               Home Medications    Prior to Admission medications   Medication Sig Start Date End Date Taking? Authorizing Provider  naproxen (NAPROSYN) 375 MG tablet Take 1 tablet (375 mg total) by mouth 2 (two) times daily. 12/15/23  Yes Jago Carton, Noberto Retort, PA-C    Family History History reviewed. No pertinent family history.  Social History Social History   Tobacco Use   Smoking status: Never   Smokeless tobacco: Never  Vaping Use   Vaping status: Never Used  Substance Use Topics   Alcohol use: Yes   Drug use: Never     Allergies   Patient  has no known allergies.   Review of Systems Review of Systems  Constitutional:  Positive for activity change. Negative for appetite change, fatigue and fever.  Musculoskeletal:  Positive for arthralgias and joint swelling. Negative for myalgias.  Skin:  Negative for color change and wound.  Neurological:  Negative for dizziness, weakness, light-headedness, numbness and headaches.     Physical Exam Triage Vital Signs ED Triage Vitals  Encounter Vitals Group     BP 12/15/23 1321 125/83     Systolic BP Percentile --      Diastolic BP Percentile --      Pulse Rate 12/15/23 1321 79     Resp 12/15/23 1321 20     Temp 12/15/23 1321 98.6 F (37 C)     Temp Source 12/15/23 1321 Oral     SpO2 12/15/23 1321 99 %     Weight 12/15/23 1316 183 lb 9.6 oz (83.3 kg)     Height 12/15/23 1316 5\' 9"  (1.753 m)     Head Circumference --      Peak Flow --      Pain Score 12/15/23  1316 9     Pain Loc --      Pain Education --      Exclude from Growth Chart --    No data found.  Updated Vital Signs BP 125/83 (BP Location: Left Arm)   Pulse 79   Temp 98.6 F (37 C) (Oral)   Resp 20   Ht 5\' 9"  (1.753 m)   Wt 183 lb 9.6 oz (83.3 kg)   LMP 11/23/2023   SpO2 99%   BMI 27.11 kg/m   Visual Acuity Right Eye Distance:   Left Eye Distance:   Bilateral Distance:    Right Eye Near:   Left Eye Near:    Bilateral Near:     Physical Exam Vitals reviewed.  Constitutional:      General: She is awake. She is not in acute distress.    Appearance: Normal appearance. She is well-developed. She is not ill-appearing.     Comments: Very pleasant female appears stated age in no acute distress sitting comfortably in exam room  HENT:     Head: Normocephalic and atraumatic.     Mouth/Throat:     Pharynx: Uvula midline. No oropharyngeal exudate or posterior oropharyngeal erythema.  Cardiovascular:     Rate and Rhythm: Normal rate and regular rhythm.     Pulses:          Radial pulses are 2+ on the  right side.     Heart sounds: Normal heart sounds, S1 normal and S2 normal. No murmur heard.    Comments: Capillary fill within 2 seconds right fingers Pulmonary:     Effort: Pulmonary effort is normal.     Breath sounds: Normal breath sounds. No wheezing, rhonchi or rales.     Comments: Clear to auscultation bilaterally Musculoskeletal:     Right elbow: Swelling present. No deformity. Decreased range of motion. Tenderness present in olecranon process.     Comments: Right elbow: Swelling and tenderness over olecranon process.  No deformity noted.  Decreased range of motion with flexion.  Hand neurovascularly intact.  Normal pincer grip strength.  Normal active range of motion without tenderness or deformity of both shoulder and wrist.  Psychiatric:        Behavior: Behavior is cooperative.      UC Treatments / Results  Labs (all labs ordered are listed, but only abnormal results are displayed) Labs Reviewed - No data to display  EKG   Radiology DG Elbow Complete Right Result Date: 12/15/2023 CLINICAL DATA:  Fall at work onto right elbow EXAM: RIGHT ELBOW - COMPLETE 4 VIEW COMPARISON:  None Available. FINDINGS: There is no evidence of fracture, dislocation, or joint effusion. There is no evidence of arthropathy or other focal bone abnormality. Soft tissues are unremarkable. IMPRESSION: No acute fracture or dislocation. Electronically Signed   By: Agustin Cree M.D.   On: 12/15/2023 13:54    Procedures Procedures (including critical care time)  Medications Ordered in UC Medications - No data to display  Initial Impression / Assessment and Plan / UC Course  I have reviewed the triage vital signs and the nursing notes.  Pertinent labs & imaging results that were available during my care of the patient were reviewed by me and considered in my medical decision making (see chart for details).     Patient is well-appearing, afebrile, nontoxic, nontachycardic.  No indication for head or  neck CT based on [place] CT rules and mechanism of injury.  X-ray of right elbow was obtained  that showed no acute osseous abnormality.  Discussed that contusion is likely etiology of symptoms.  She was placed in Ace bandage for comfort and support and encouraged to use RICE protocol for symptom relief.  She was given Naprosyn for pain relief with instruction not to take additional NSAIDs with this medication due to risk of GI bleeding but can use acetaminophen/Tylenol as needed for breakthrough pain.  Recommended follow-up with orthopedics if symptoms or not improving quickly and she was given the contact information for local provider with instruction to call to schedule an appointment.  Discussed that if anything worsens or changes since she has increasing pain, swelling, weakness, numbness, paresthesias she needs to be seen immediately.  Strict return precautions given.  Excuse note provided.  Final Clinical Impressions(s) / UC Diagnoses   Final diagnoses:  Right elbow pain  Fall, initial encounter  Contusion of right elbow, initial encounter     Discharge Instructions      Your x-ray was normal with no evidence of fracture/broken bone or dislocation.  I suspect you have injured the soft tissue.  Use the Ace bandage to help provide comfort and support.  Keep it elevated and avoid strenuous activity including heavy lifting or repetitive motion.  Use ice for 15 minutes at a time multiple times a day.  Start Naprosyn twice daily for pain relief.  Do not take NSAIDs with this medication including aspirin, ibuprofen/Advil, naproxen/Aleve.  You can use Tylenol/acetaminophen for breakthrough pain.  Follow-up with orthopedics if your symptoms are not improving within a week.  Call them to schedule an appointment.  If anything worsens you have increasing pain, swelling, numbness, tingling, skin discoloration you should be seen immediately.     ED Prescriptions     Medication Sig Dispense Auth.  Provider   naproxen (NAPROSYN) 375 MG tablet Take 1 tablet (375 mg total) by mouth 2 (two) times daily. 20 tablet Taydon Nasworthy, Noberto Retort, PA-C      PDMP not reviewed this encounter.   Jeani Hawking, PA-C 12/15/23 1415

## 2023-12-15 NOTE — ED Triage Notes (Signed)
Patient states she fell at work last night, landed on right elbow. Treated with Tylenol.

## 2023-12-15 NOTE — Discharge Instructions (Signed)
Your x-ray was normal with no evidence of fracture/broken bone or dislocation.  I suspect you have injured the soft tissue.  Use the Ace bandage to help provide comfort and support.  Keep it elevated and avoid strenuous activity including heavy lifting or repetitive motion.  Use ice for 15 minutes at a time multiple times a day.  Start Naprosyn twice daily for pain relief.  Do not take NSAIDs with this medication including aspirin, ibuprofen/Advil, naproxen/Aleve.  You can use Tylenol/acetaminophen for breakthrough pain.  Follow-up with orthopedics if your symptoms are not improving within a week.  Call them to schedule an appointment.  If anything worsens you have increasing pain, swelling, numbness, tingling, skin discoloration you should be seen immediately.

## 2024-07-22 ENCOUNTER — Ambulatory Visit
Admission: EM | Admit: 2024-07-22 | Discharge: 2024-07-22 | Disposition: A | Discharge status: Home / Self Care | Attending: Physician Assistant | Admitting: Physician Assistant

## 2024-07-22 ENCOUNTER — Ambulatory Visit (INDEPENDENT_AMBULATORY_CARE_PROVIDER_SITE_OTHER)

## 2024-07-22 ENCOUNTER — Encounter: Payer: Self-pay | Admitting: Emergency Medicine

## 2024-07-22 DIAGNOSIS — S99921A Unspecified injury of right foot, initial encounter: Secondary | ICD-10-CM

## 2024-07-22 DIAGNOSIS — S99911A Unspecified injury of right ankle, initial encounter: Secondary | ICD-10-CM | POA: Diagnosis not present

## 2024-07-22 NOTE — ED Provider Notes (Signed)
 [name]    CSN: [id number] Arrival date & time: 07/22/24  1622      History   Chief Complaint No chief complaint on file.   HPI ANTASIA HAIDER is a 28 y.o. female.   HPI  Past Medical History:  Diagnosis Date   Medical history non-contributory     There are no active problems to display for this patient.   No past surgical history on file.  OB History     Gravida  1   Para      Term      Preterm      AB      Living         SAB      IAB      Ectopic      Multiple      Live Births               Home Medications    Prior to Admission medications   Medication Sig Start Date End Date Taking? Authorizing Provider  naproxen  (NAPROSYN ) 375 MG tablet Take 1 tablet (375 mg total) by mouth 2 (two) times daily. 12/15/23   Raspet, Rocky POUR, PA-C    Family History No family history on file.  Social History Social History   Tobacco Use   Smoking status: Never   Smokeless tobacco: Never  Vaping Use   Vaping status: Never Used  Substance Use Topics   Alcohol use: Yes   Drug use: Never     Allergies   Patient has no known allergies.   Review of Systems Review of Systems  Constitutional:  Negative for chills and fever.  Eyes:  Negative for discharge and redness.  Respiratory:  Negative for shortness of breath.   Gastrointestinal:  Negative for abdominal pain, nausea and vomiting.  Musculoskeletal:  Positive for arthralgias.     Physical Exam Triage Vital Signs ED Triage Vitals  Encounter Vitals Group     BP      Girls Systolic BP Percentile      Girls Diastolic BP Percentile      Boys Systolic BP Percentile      Boys Diastolic BP Percentile      Pulse      Resp      Temp      Temp src      SpO2      Weight      Height      Head Circumference      Peak Flow      Pain Score      Pain Loc      Pain Education      Exclude from Growth Chart    No data found.  Updated Vital Signs There were no vitals  taken for this visit.  Visual Acuity Right Eye Distance:   Left Eye Distance:   Bilateral Distance:    Right Eye Near:   Left Eye Near:    Bilateral Near:     Physical Exam Vitals and nursing note reviewed.  Constitutional:      General: She is not in acute distress.    Appearance: Normal appearance. She is not ill-appearing.  HENT:     Head: Normocephalic and atraumatic.  Eyes:     Conjunctiva/sclera: Conjunctivae normal.  Cardiovascular:     Rate and Rhythm: Normal rate.  Pulmonary:     Effort: Pulmonary effort is normal.  Neurological:     Mental Status: She  is alert.  Psychiatric:        Mood and Affect: Mood normal.        Behavior: Behavior normal.        Thought Content: Thought content normal.      UC Treatments / Results  Labs (all labs ordered are listed, but only abnormal results are displayed) Labs Reviewed - No data to display  EKG   Radiology No results found.  Procedures Procedures (including critical care time)  Medications Ordered in UC Medications - No data to display  Initial Impression / Assessment and Plan / UC Course  I have reviewed the triage vital signs and the nursing notes.  Pertinent labs & imaging results that were available during my care of the patient were reviewed by me and considered in my medical decision making (see chart for details).     *** Final Clinical Impressions(s) / UC Diagnoses   Final diagnoses:  None   Discharge Instructions   None    ED Prescriptions   None    PDMP not reviewed this encounter.

## 2024-07-22 NOTE — ED Triage Notes (Addendum)
 Pt st's she was playing basketball and turned her right ankle.  Pt c/o pain to right ankle and right lat foot.  Injury occurred approx 2 hours ago  No previous injury to this foot or ankle
